# Patient Record
Sex: Female | Born: 1980 | Race: White | Hispanic: No | Marital: Single | Smoking: Never smoker
Health system: Southern US, Community
[De-identification: ages and names within clinical notes are randomized; demographics above are authoritative.]

## PROBLEM LIST (undated history)

## (undated) DIAGNOSIS — J45909 Unspecified asthma, uncomplicated: Secondary | ICD-10-CM

## (undated) DIAGNOSIS — L988 Other specified disorders of the skin and subcutaneous tissue: Secondary | ICD-10-CM

## (undated) DIAGNOSIS — Z8679 Personal history of other diseases of the circulatory system: Secondary | ICD-10-CM

## (undated) DIAGNOSIS — Z98811 Dental restoration status: Secondary | ICD-10-CM

## (undated) DIAGNOSIS — E039 Hypothyroidism, unspecified: Secondary | ICD-10-CM

## (undated) DIAGNOSIS — R21 Rash and other nonspecific skin eruption: Secondary | ICD-10-CM

## (undated) HISTORY — PX: TYMPANOSTOMY TUBE PLACEMENT: SHX32

## (undated) HISTORY — PX: WISDOM TOOTH EXTRACTION: SHX21

---

## 2003-10-08 ENCOUNTER — Other Ambulatory Visit: Admission: RE | Admit: 2003-10-08 | Discharge: 2003-10-08 | Payer: Self-pay | Admitting: Obstetrics & Gynecology

## 2009-05-31 ENCOUNTER — Ambulatory Visit: Payer: Self-pay | Admitting: Diagnostic Radiology

## 2009-05-31 ENCOUNTER — Emergency Department (HOSPITAL_BASED_OUTPATIENT_CLINIC_OR_DEPARTMENT_OTHER): Admission: EM | Admit: 2009-05-31 | Discharge: 2009-05-31 | Payer: Self-pay | Admitting: Emergency Medicine

## 2010-04-20 DIAGNOSIS — Z8679 Personal history of other diseases of the circulatory system: Secondary | ICD-10-CM

## 2010-04-20 HISTORY — DX: Personal history of other diseases of the circulatory system: Z86.79

## 2010-07-10 LAB — DIFFERENTIAL
Basophils Relative: 1 % (ref 0–1)
Eosinophils Absolute: 0.2 10*3/uL (ref 0.0–0.7)
Lymphocytes Relative: 16 % (ref 12–46)
Lymphs Abs: 1.7 10*3/uL (ref 0.7–4.0)
Monocytes Absolute: 0.8 10*3/uL (ref 0.1–1.0)
Neutro Abs: 7.7 10*3/uL (ref 1.7–7.7)

## 2010-07-10 LAB — CBC
HCT: 43.6 % (ref 36.0–46.0)
Hemoglobin: 14.9 g/dL (ref 12.0–15.0)
RBC: 4.64 MIL/uL (ref 3.87–5.11)
WBC: 10.5 10*3/uL (ref 4.0–10.5)

## 2010-07-10 LAB — BASIC METABOLIC PANEL
Calcium: 9.5 mg/dL (ref 8.4–10.5)
GFR calc non Af Amer: >60 mL/min (ref >60)
Potassium: 4.1 mEq/L (ref 3.5–5.1)

## 2010-07-10 LAB — PREGNANCY, URINE: Preg Test, Ur: NEGATIVE

## 2010-07-10 LAB — URINALYSIS, ROUTINE W REFLEX MICROSCOPIC
Bilirubin Urine: NEGATIVE
Ketones, ur: NEGATIVE mg/dL
Nitrite: NEGATIVE
Protein, ur: NEGATIVE mg/dL

## 2010-07-10 LAB — D-DIMER, QUANTITATIVE: D-Dimer, Quant: <0.22 ug/mL-FEU (ref 0.00–0.48)

## 2012-05-21 DIAGNOSIS — L988 Other specified disorders of the skin and subcutaneous tissue: Secondary | ICD-10-CM

## 2012-05-21 HISTORY — DX: Other specified disorders of the skin and subcutaneous tissue: L98.8

## 2012-05-25 ENCOUNTER — Other Ambulatory Visit: Payer: Self-pay | Admitting: Physician Assistant

## 2012-05-25 DIAGNOSIS — H8309 Labyrinthitis, unspecified ear: Secondary | ICD-10-CM

## 2012-05-25 DIAGNOSIS — H903 Sensorineural hearing loss, bilateral: Secondary | ICD-10-CM

## 2012-05-25 DIAGNOSIS — H905 Unspecified sensorineural hearing loss: Secondary | ICD-10-CM

## 2012-05-25 DIAGNOSIS — R42 Dizziness and giddiness: Secondary | ICD-10-CM

## 2012-05-26 ENCOUNTER — Ambulatory Visit (INDEPENDENT_AMBULATORY_CARE_PROVIDER_SITE_OTHER): Payer: Managed Care, Other (non HMO) | Admitting: Otolaryngology

## 2012-05-26 DIAGNOSIS — H8319 Labyrinthine fistula, unspecified ear: Secondary | ICD-10-CM

## 2012-05-26 DIAGNOSIS — R42 Dizziness and giddiness: Secondary | ICD-10-CM

## 2012-05-26 DIAGNOSIS — H905 Unspecified sensorineural hearing loss: Secondary | ICD-10-CM

## 2012-05-27 ENCOUNTER — Ambulatory Visit
Admission: RE | Admit: 2012-05-27 | Discharge: 2012-05-27 | Disposition: A | Discharge status: Home / Self Care | Payer: Managed Care, Other (non HMO) | Source: Ambulatory Visit | Attending: Physician Assistant | Admitting: Physician Assistant

## 2012-05-27 DIAGNOSIS — H8309 Labyrinthitis, unspecified ear: Secondary | ICD-10-CM

## 2012-05-27 DIAGNOSIS — R42 Dizziness and giddiness: Secondary | ICD-10-CM

## 2012-05-27 DIAGNOSIS — H905 Unspecified sensorineural hearing loss: Secondary | ICD-10-CM

## 2012-06-09 ENCOUNTER — Other Ambulatory Visit (INDEPENDENT_AMBULATORY_CARE_PROVIDER_SITE_OTHER): Payer: Self-pay | Admitting: Otolaryngology

## 2012-06-09 DIAGNOSIS — R42 Dizziness and giddiness: Secondary | ICD-10-CM

## 2012-06-11 ENCOUNTER — Ambulatory Visit
Admission: RE | Admit: 2012-06-11 | Discharge: 2012-06-11 | Disposition: A | Discharge status: Home / Self Care | Payer: Managed Care, Other (non HMO) | Source: Ambulatory Visit | Attending: Otolaryngology | Admitting: Otolaryngology

## 2012-06-11 DIAGNOSIS — R42 Dizziness and giddiness: Secondary | ICD-10-CM

## 2012-06-11 MED ORDER — GADOBENATE DIMEGLUMINE 529 MG/ML IV SOLN
11.0000 mL | Freq: Once | INTRAVENOUS | Status: AC | PRN
  Administered 2012-06-11: 11 mL via INTRAVENOUS

## 2012-06-13 ENCOUNTER — Encounter (HOSPITAL_BASED_OUTPATIENT_CLINIC_OR_DEPARTMENT_OTHER): Payer: Self-pay | Admitting: *Deleted

## 2012-06-13 ENCOUNTER — Other Ambulatory Visit: Payer: Managed Care, Other (non HMO)

## 2012-06-13 DIAGNOSIS — R21 Rash and other nonspecific skin eruption: Secondary | ICD-10-CM

## 2012-06-13 HISTORY — DX: Rash and other nonspecific skin eruption: R21

## 2012-06-14 ENCOUNTER — Other Ambulatory Visit: Payer: Self-pay | Admitting: Otolaryngology

## 2012-06-14 ENCOUNTER — Ambulatory Visit (HOSPITAL_BASED_OUTPATIENT_CLINIC_OR_DEPARTMENT_OTHER)
Admission: RE | Admit: 2012-06-14 | Payer: Managed Care, Other (non HMO) | Source: Ambulatory Visit | Admitting: Otolaryngology

## 2012-06-14 DIAGNOSIS — H905 Unspecified sensorineural hearing loss: Secondary | ICD-10-CM

## 2012-06-14 HISTORY — DX: Dental restoration status: Z98.811

## 2012-06-14 HISTORY — DX: Other specified disorders of the skin and subcutaneous tissue: L98.8

## 2012-06-14 HISTORY — DX: Personal history of other diseases of the circulatory system: Z86.79

## 2012-06-14 HISTORY — DX: Rash and other nonspecific skin eruption: R21

## 2012-06-14 HISTORY — DX: Hypothyroidism, unspecified: E03.9

## 2012-06-14 HISTORY — DX: Unspecified asthma, uncomplicated: J45.909

## 2012-06-14 SURGERY — TYMPANOPLASTY, WITH MASTOIDECTOMY
Anesthesia: General | Laterality: Right

## 2012-06-15 ENCOUNTER — Other Ambulatory Visit: Payer: Managed Care, Other (non HMO)

## 2012-06-15 ENCOUNTER — Ambulatory Visit
Admission: RE | Admit: 2012-06-15 | Discharge: 2012-06-15 | Disposition: A | Discharge status: Home / Self Care | Payer: Managed Care, Other (non HMO) | Source: Ambulatory Visit | Attending: Otolaryngology | Admitting: Otolaryngology

## 2012-06-15 ENCOUNTER — Other Ambulatory Visit: Payer: Self-pay | Admitting: Otolaryngology

## 2012-06-15 DIAGNOSIS — H905 Unspecified sensorineural hearing loss: Secondary | ICD-10-CM

## 2012-06-15 MED ORDER — GADOBENATE DIMEGLUMINE 529 MG/ML IV SOLN: 20.0000 mL | Freq: Once | INTRAVENOUS | Status: AC | PRN

## 2012-06-16 ENCOUNTER — Other Ambulatory Visit: Payer: Managed Care, Other (non HMO)

## 2012-07-13 ENCOUNTER — Ambulatory Visit
Admission: RE | Admit: 2012-07-13 | Discharge: 2012-07-13 | Disposition: A | Discharge status: Home / Self Care | Payer: Managed Care, Other (non HMO) | Source: Ambulatory Visit | Attending: Family Medicine | Admitting: Family Medicine

## 2012-07-13 ENCOUNTER — Other Ambulatory Visit: Payer: Self-pay | Admitting: Family Medicine

## 2012-07-13 DIAGNOSIS — M542 Cervicalgia: Secondary | ICD-10-CM

## 2013-05-09 ENCOUNTER — Ambulatory Visit (INDEPENDENT_AMBULATORY_CARE_PROVIDER_SITE_OTHER): Payer: Managed Care, Other (non HMO) | Admitting: Sports Medicine

## 2013-05-09 ENCOUNTER — Encounter: Payer: Self-pay | Admitting: Sports Medicine

## 2013-05-09 VITALS — BP 128/71 | HR 74 | Wt 125.0 lb

## 2013-05-09 DIAGNOSIS — M25539 Pain in unspecified wrist: Secondary | ICD-10-CM

## 2013-05-09 MED ORDER — MELOXICAM 15 MG PO TABS: ORAL_TABLET | ORAL | Status: AC

## 2013-05-09 NOTE — Assessment & Plan Note (Signed)
Etiology is unclear however this likely does not represent carpal tunnel syndrome or lateral epicondylitis. Neurologic check some blood work looking for rheumatic causes, as well as CK. We should also keep exertional compartment syndrome in the back of our head. We are going to also start conservatively with Mobic, she will work with her physical therapist on some exercises and kinesiotaping. I would like to see her back in about 3 weeks to see if things are going.

## 2013-05-09 NOTE — Progress Notes (Signed)
   Subjective:    I'm seeing this patient as a consultation for:  Chari ManningAlexander Augoustides, MD  CC: Bilateral forearm pain  HPI: This is a very pleasant 33 year old female, she works in data entry, for the past 9 months she has had intermittent pain, and soreness on the dorsal mid forearm muscles. Initially she suspected tennis elbow, she wore a counterbrace which was ineffective. Subsequently she suspected carpal tunnel syndrome, she has since worn volar splints, this is improved her symptoms slightly, but not sufficiently. She denies any numbness or tingling into the hands or forearms, no pain in her neck. She denies any trauma. She simply gets a tightness and sore sensation in her dorsal forearm musculature. Pain is moderate, persistent. The most recent episode lasted several days.  Past medical history, Surgical history, Family history not pertinant except as noted below, Social history, Allergies, and medications have been entered into the medical record, reviewed, and no changes needed.   Review of Systems: No headache, visual changes, nausea, vomiting, diarrhea, constipation, dizziness, abdominal pain, skin rash, fevers, chills, night sweats, weight loss, swollen lymph nodes, body aches, joint swelling, muscle aches, chest pain, shortness of breath, mood changes, visual or auditory hallucinations.   Objective:   General: Well Developed, well nourished, and in no acute distress.  Neuro/Psych: Alert and oriented x3, extra-ocular muscles intact, able to move all 4 extremities, sensation grossly intact. Skin: Warm and dry, no rashes noted.  Respiratory: Not using accessory muscles, speaking in full sentences, trachea midline.  Cardiovascular: Pulses palpable, no extremity edema. Abdomen: Does not appear distended. Bilateral Elbow: Unremarkable to inspection. Range of motion full pronation, supination, flexion, extension. Strength is full to all of the above directions Stable to varus, valgus  stress. Negative moving valgus stress test. There is a discrete area of tenderness to palpation over the extensor carpi radialis longus bilaterally. I am unable to reproduce pain with resisted extension, flexion, ulnar, or radial deviation of the wrist. Negative cubital tunnel Tinel's. Bilateral Wrist: Inspection normal with no visible erythema or swelling. ROM smooth and normal with good flexion and extension and ulnar/radial deviation that is symmetrical with opposite wrist. Palpation is normal over metacarpals, navicular, lunate, and TFCC; tendons without tenderness/ swelling No snuffbox tenderness. No tenderness over Canal of [place]. Strength 5/5 in all directions without pain. Negative Finkelstein, tinel's and phalens. Negative Watson's test.  Impression and Recommendations:   This case required medical decision making of moderate complexity.

## 2013-05-30 ENCOUNTER — Ambulatory Visit: Payer: Managed Care, Other (non HMO) | Admitting: Sports Medicine

## 2013-05-30 DIAGNOSIS — Z0289 Encounter for other administrative examinations: Secondary | ICD-10-CM

## 2014-02-03 IMAGING — CR DG CERVICAL SPINE COMPLETE 4+V
5 series · 5 of 5 positions shown · non-contrast
Comparison: None.

CLINICAL DATA: Neck pain.

CERVICAL SPINE - COMPLETE 4+ VIEW

[view not recorded (1 of 5)]
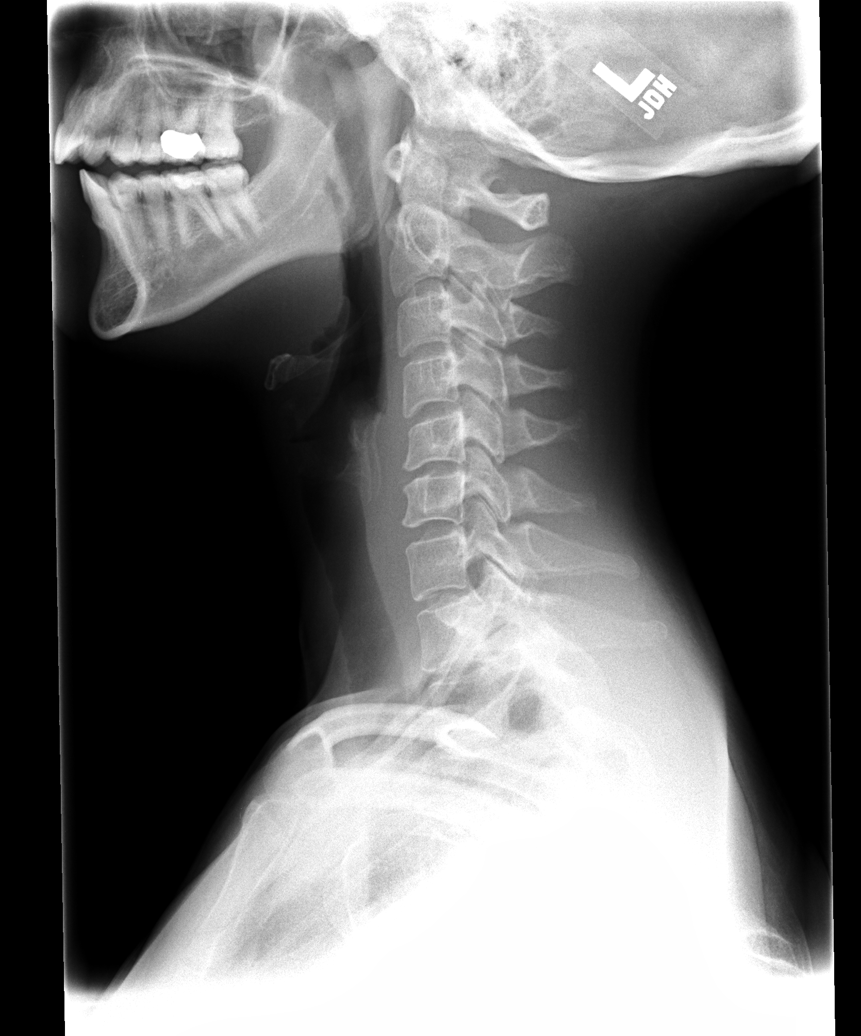

[view not recorded (2 of 5)]
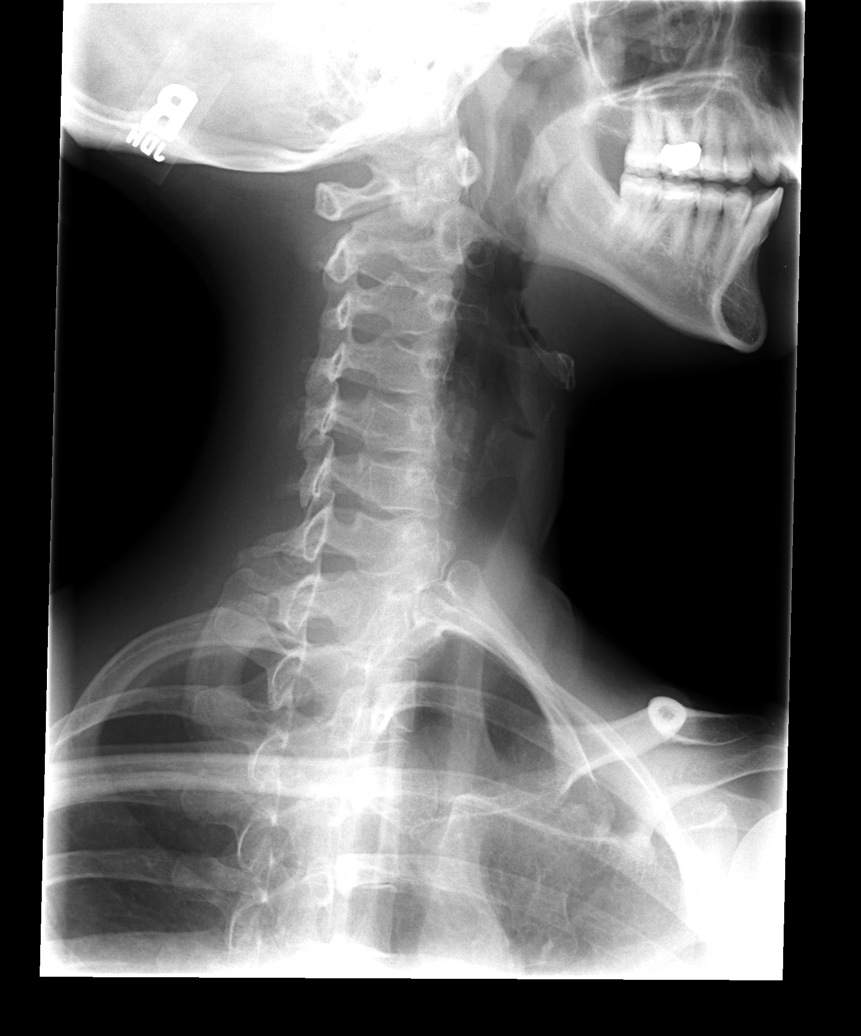

[view not recorded (3 of 5)]
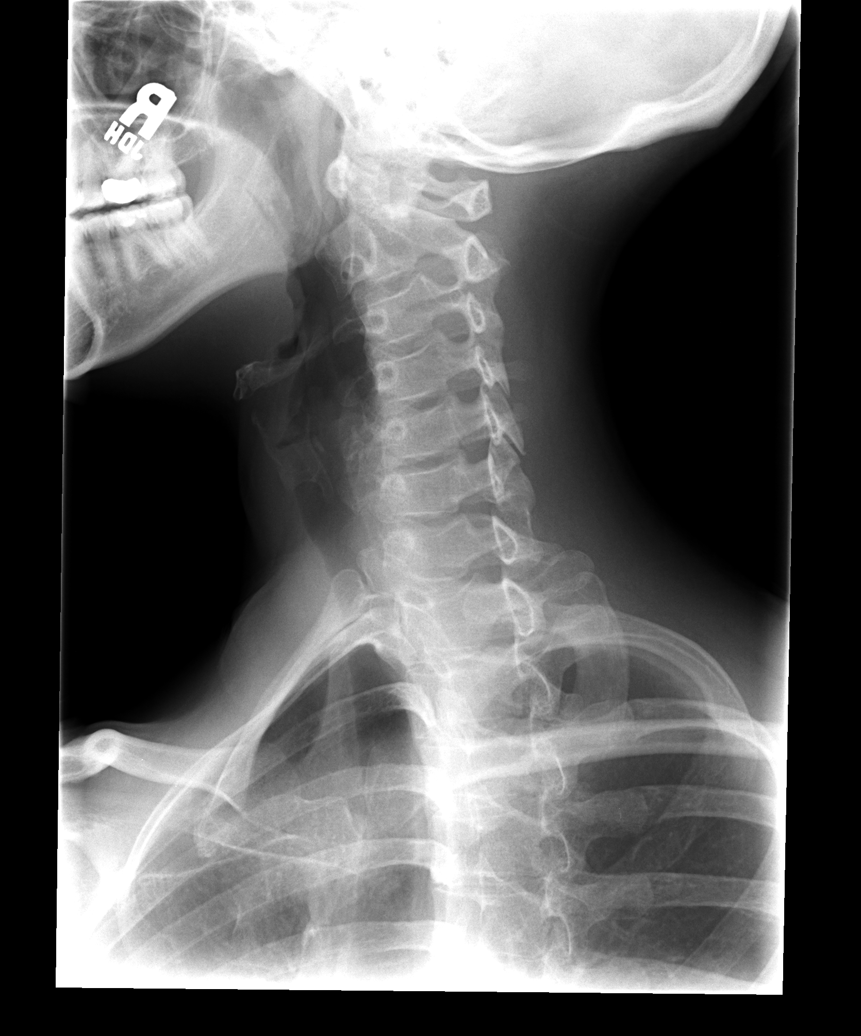

[view not recorded (4 of 5)]
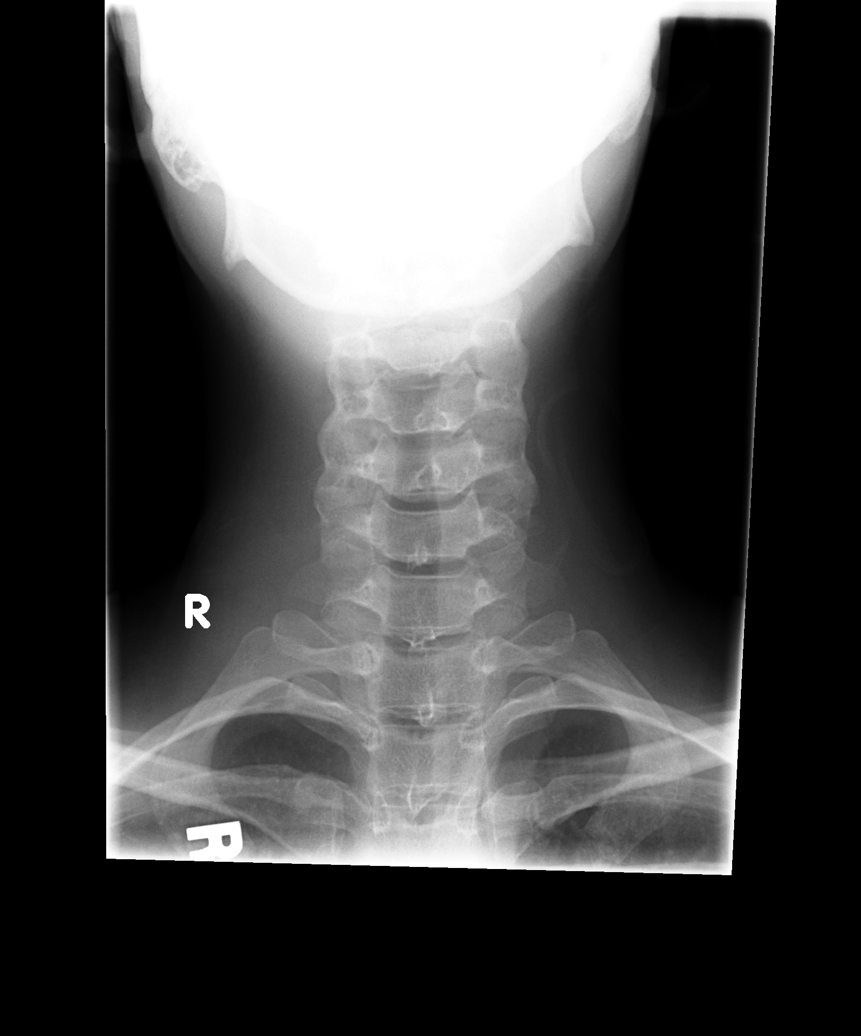

[view not recorded (5 of 5)]
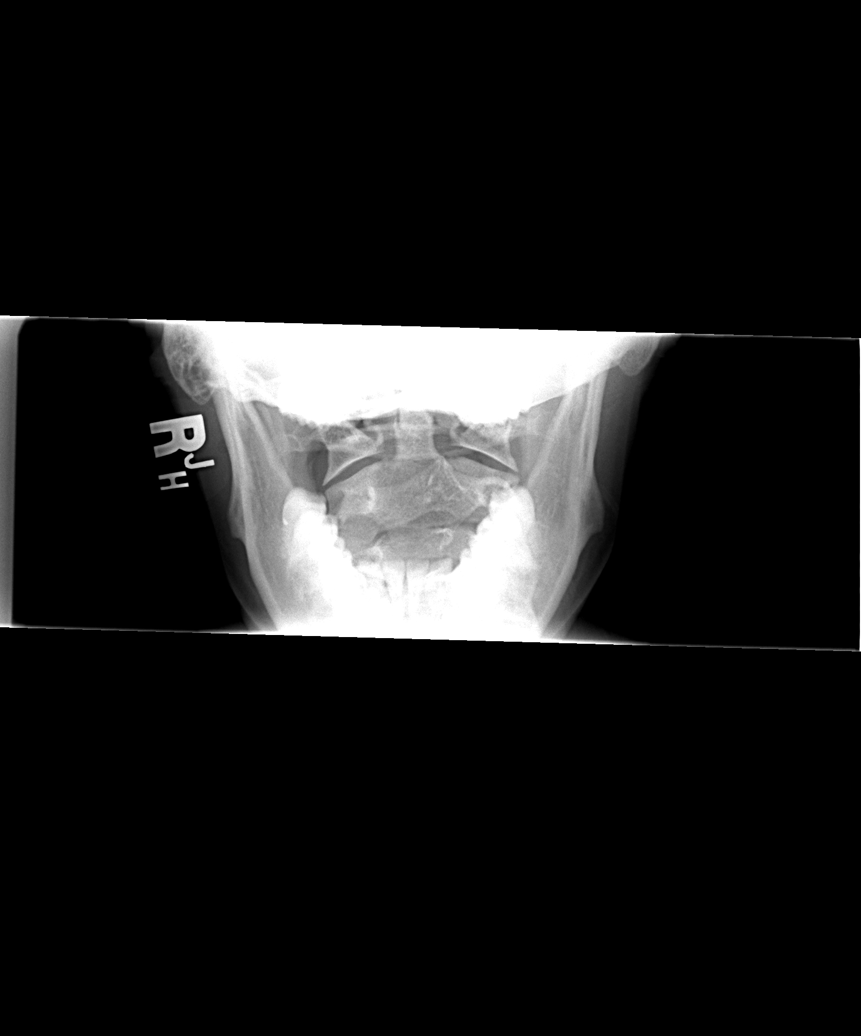

[5 of 5 positions shown; findings below may reference images not displayed]

FINDINGS: There is straightening of the normal cervical lordosis.
No prevertebral soft tissue swelling is identified.  No cervical
vertebral malalignment noted.  No cervical spine fracture is
evident.
IMPRESSION: 1. Straightening of the normal cervical lordosis, possibly
indicating muscle spasm.  Otherwise negative.
